# Patient Record
Sex: Female | Born: 1955 | Race: White | Hispanic: No | Smoking: Former smoker
Health system: Southern US, Community
[De-identification: ages and names within clinical notes are randomized; demographics above are authoritative.]

## PROBLEM LIST (undated history)

## (undated) DIAGNOSIS — I10 Essential (primary) hypertension: Secondary | ICD-10-CM

## (undated) DIAGNOSIS — R519 Headache, unspecified: Secondary | ICD-10-CM

## (undated) DIAGNOSIS — F419 Anxiety disorder, unspecified: Secondary | ICD-10-CM

## (undated) DIAGNOSIS — G4700 Insomnia, unspecified: Secondary | ICD-10-CM

## (undated) DIAGNOSIS — K219 Gastro-esophageal reflux disease without esophagitis: Secondary | ICD-10-CM

## (undated) DIAGNOSIS — R51 Headache: Secondary | ICD-10-CM

## (undated) HISTORY — PX: ABDOMINAL HYSTERECTOMY: SHX81

## (undated) HISTORY — PX: TONSILLECTOMY: SUR1361

---

## 2006-02-16 ENCOUNTER — Ambulatory Visit: Payer: Self-pay | Admitting: Gastroenterology

## 2006-05-04 ENCOUNTER — Ambulatory Visit: Payer: Self-pay | Admitting: Family Medicine

## 2006-05-12 ENCOUNTER — Ambulatory Visit: Payer: Self-pay | Admitting: Family Medicine

## 2007-06-22 ENCOUNTER — Ambulatory Visit: Payer: Self-pay | Admitting: Family Medicine

## 2010-11-04 ENCOUNTER — Ambulatory Visit: Payer: Self-pay | Admitting: Internal Medicine

## 2011-09-15 ENCOUNTER — Emergency Department: Payer: Self-pay | Admitting: Emergency Medicine

## 2011-09-15 LAB — TROPONIN I: Troponin-I: <0.02 ng/mL

## 2011-09-15 LAB — CBC
MCH: 30.2 pg (ref 26.0–34.0)
MCHC: 34 g/dL (ref 32.0–36.0)
MCV: 89 fL (ref 80–100)
Platelet: 299 10*3/uL (ref 150–440)
RDW: 13.1 % (ref 11.5–14.5)
WBC: 8.4 10*3/uL (ref 3.6–11.0)

## 2011-09-15 LAB — COMPREHENSIVE METABOLIC PANEL
Albumin: 4.4 g/dL (ref 3.4–5.0)
Alkaline Phosphatase: 79 U/L (ref 50–136)
BUN: 14 mg/dL (ref 7–18)
Bilirubin,Total: 0.7 mg/dL (ref 0.2–1.0)
Calcium, Total: 9.4 mg/dL (ref 8.5–10.1)
Creatinine: 0.72 mg/dL (ref 0.60–1.30)
Glucose: 99 mg/dL (ref 65–99)
Osmolality: 284 (ref 275–301)
Potassium: 3.4 mmol/L — ABNORMAL LOW (ref 3.5–5.1)
SGPT (ALT): 43 U/L (ref 12–78)
Sodium: 142 mmol/L (ref 136–145)
Total Protein: 8.2 g/dL (ref 6.4–8.2)

## 2011-09-15 LAB — LIPASE, BLOOD: Lipase: 141 U/L (ref 73–393)

## 2011-09-15 LAB — MAGNESIUM: Magnesium: 1.9 mg/dL

## 2011-09-15 LAB — CK TOTAL AND CKMB (NOT AT ARMC): CK, Total: 82 U/L (ref 21–215)

## 2013-01-04 ENCOUNTER — Ambulatory Visit: Payer: Self-pay | Admitting: Physician Assistant

## 2014-06-18 ENCOUNTER — Other Ambulatory Visit: Payer: Self-pay

## 2014-06-18 DIAGNOSIS — Z1231 Encounter for screening mammogram for malignant neoplasm of breast: Secondary | ICD-10-CM

## 2014-07-10 ENCOUNTER — Ambulatory Visit
Admission: RE | Admit: 2014-07-10 | Discharge: 2014-07-10 | Disposition: A | Discharge status: Home / Self Care | Payer: BLUE CROSS/BLUE SHIELD | Source: Ambulatory Visit | Attending: Family Medicine | Admitting: Family Medicine

## 2014-07-10 DIAGNOSIS — Z1231 Encounter for screening mammogram for malignant neoplasm of breast: Secondary | ICD-10-CM | POA: Diagnosis not present

## 2014-07-10 DIAGNOSIS — R922 Inconclusive mammogram: Secondary | ICD-10-CM | POA: Diagnosis not present

## 2015-01-23 ENCOUNTER — Other Ambulatory Visit: Payer: Self-pay | Admitting: Orthopedic Surgery

## 2015-01-23 DIAGNOSIS — M25561 Pain in right knee: Principal | ICD-10-CM

## 2015-01-23 DIAGNOSIS — G8929 Other chronic pain: Secondary | ICD-10-CM

## 2015-02-12 ENCOUNTER — Ambulatory Visit
Admission: RE | Admit: 2015-02-12 | Discharge: 2015-02-12 | Disposition: A | Discharge status: Home / Self Care | Payer: BLUE CROSS/BLUE SHIELD | Source: Ambulatory Visit | Attending: Orthopedic Surgery | Admitting: Orthopedic Surgery

## 2015-02-12 DIAGNOSIS — M238X1 Other internal derangements of right knee: Secondary | ICD-10-CM | POA: Diagnosis not present

## 2015-02-12 DIAGNOSIS — M25561 Pain in right knee: Secondary | ICD-10-CM | POA: Diagnosis present

## 2015-02-12 DIAGNOSIS — M179 Osteoarthritis of knee, unspecified: Secondary | ICD-10-CM | POA: Insufficient documentation

## 2015-02-12 DIAGNOSIS — M23321 Other meniscus derangements, posterior horn of medial meniscus, right knee: Secondary | ICD-10-CM | POA: Insufficient documentation

## 2015-02-12 DIAGNOSIS — G8929 Other chronic pain: Secondary | ICD-10-CM | POA: Insufficient documentation

## 2015-03-26 ENCOUNTER — Other Ambulatory Visit: Payer: Self-pay

## 2015-03-26 ENCOUNTER — Encounter: Payer: Self-pay | Admitting: *Deleted

## 2015-03-26 ENCOUNTER — Encounter
Admission: RE | Admit: 2015-03-26 | Discharge: 2015-03-26 | Disposition: A | Discharge status: Home / Self Care | Payer: BLUE CROSS/BLUE SHIELD | Source: Ambulatory Visit | Attending: Orthopedic Surgery | Admitting: Orthopedic Surgery

## 2015-03-26 DIAGNOSIS — K219 Gastro-esophageal reflux disease without esophagitis: Secondary | ICD-10-CM | POA: Diagnosis not present

## 2015-03-26 DIAGNOSIS — Z79899 Other long term (current) drug therapy: Secondary | ICD-10-CM | POA: Diagnosis not present

## 2015-03-26 DIAGNOSIS — Z90710 Acquired absence of both cervix and uterus: Secondary | ICD-10-CM | POA: Diagnosis not present

## 2015-03-26 DIAGNOSIS — Z888 Allergy status to other drugs, medicaments and biological substances status: Secondary | ICD-10-CM | POA: Diagnosis not present

## 2015-03-26 DIAGNOSIS — Z87891 Personal history of nicotine dependence: Secondary | ICD-10-CM | POA: Diagnosis not present

## 2015-03-26 DIAGNOSIS — M2391 Unspecified internal derangement of right knee: Secondary | ICD-10-CM | POA: Diagnosis present

## 2015-03-26 DIAGNOSIS — M2241 Chondromalacia patellae, right knee: Secondary | ICD-10-CM | POA: Diagnosis not present

## 2015-03-26 DIAGNOSIS — X58XXXA Exposure to other specified factors, initial encounter: Secondary | ICD-10-CM | POA: Diagnosis not present

## 2015-03-26 DIAGNOSIS — Z887 Allergy status to serum and vaccine status: Secondary | ICD-10-CM | POA: Diagnosis not present

## 2015-03-26 DIAGNOSIS — Z8349 Family history of other endocrine, nutritional and metabolic diseases: Secondary | ICD-10-CM | POA: Diagnosis not present

## 2015-03-26 DIAGNOSIS — Z791 Long term (current) use of non-steroidal anti-inflammatories (NSAID): Secondary | ICD-10-CM | POA: Diagnosis not present

## 2015-03-26 DIAGNOSIS — S83281A Other tear of lateral meniscus, current injury, right knee, initial encounter: Secondary | ICD-10-CM | POA: Diagnosis not present

## 2015-03-26 DIAGNOSIS — Z885 Allergy status to narcotic agent status: Secondary | ICD-10-CM | POA: Diagnosis not present

## 2015-03-26 DIAGNOSIS — Z79891 Long term (current) use of opiate analgesic: Secondary | ICD-10-CM | POA: Diagnosis not present

## 2015-03-26 DIAGNOSIS — G4700 Insomnia, unspecified: Secondary | ICD-10-CM | POA: Diagnosis not present

## 2015-03-26 DIAGNOSIS — S83241A Other tear of medial meniscus, current injury, right knee, initial encounter: Secondary | ICD-10-CM | POA: Diagnosis not present

## 2015-03-26 LAB — POTASSIUM: Potassium: 3.8 mmol/L (ref 3.5–5.1)

## 2015-03-26 NOTE — Patient Instructions (Signed)
  Your procedure is scheduled on: 03/27/15 Report to Day Surgery. [facility] To find out your arrival time please call [phone] between 1PM - 3PM on  03/26/15  Remember: Instructions that are not followed completely may result in serious medical risk, up to and including death, or upon the discretion of your surgeon and anesthesiologist your surgery may need to be rescheduled.    __X__ 1. Do not eat food or drink liquids after midnight. No gum chewing or hard candies.     __X__ 2. No Alcohol for 24 hours before or after surgery.   ____ 3. Bring all medications with you on the day of surgery if instructed.    _X___ 4. Notify your doctor if there is any change in your medical condition     (cold, fever, infections).     Do not wear jewelry, make-up, hairpins, clips or nail polish.  Do not wear lotions, powders, or perfumes. You may wear deodorant.  Do not shave 48 hours prior to surgery. Men may shave face and neck.  Do not bring valuables to the hospital.    [facility] is not responsible for any belongings or valuables.               Contacts, dentures or bridgework may not be worn into surgery.  Leave your suitcase in the car. After surgery it may be brought to your room.  For patients admitted to the hospital, discharge time is determined by your                treatment team.   Patients discharged the day of surgery will not be allowed to drive home.   Please read over the following fact sheets that you were given:   Surgical Site Infection Prevention   ____ Take these medicines the morning of surgery with A SIP OF WATER:    1. PEPCID AT BEDTIME 03/26/15 AND AM SURGERY  2.   3.   4.  5.  6.  ____ Fleet Enema (as directed)   ___X_ Use CHG Soap as directed  ____ Use inhalers on the day of surgery  ____ Stop metformin 2 days prior to surgery    ____ Take 1/2 of usual insulin dose the night before surgery and none on the morning of surgery.   ____  Stop Coumadin/Plavix/aspirin on  _X___ Stop Anti-inflammatories on 03/26/15    _X___ Stop supplements until after surgery.    ____ Bring C-Pap to the hospital.

## 2015-03-27 ENCOUNTER — Ambulatory Visit: Payer: BLUE CROSS/BLUE SHIELD | Admitting: Anesthesiology

## 2015-03-27 ENCOUNTER — Ambulatory Visit
Admission: RE | Admit: 2015-03-27 | Discharge: 2015-03-27 | Disposition: A | Discharge status: Home / Self Care | Payer: BLUE CROSS/BLUE SHIELD | Source: Ambulatory Visit | Attending: Orthopedic Surgery | Admitting: Orthopedic Surgery

## 2015-03-27 ENCOUNTER — Encounter: Payer: Self-pay | Admitting: Orthopedic Surgery

## 2015-03-27 ENCOUNTER — Encounter
Admission: RE | Disposition: A | Discharge status: Home / Self Care | Payer: Self-pay | Source: Ambulatory Visit | Attending: Orthopedic Surgery

## 2015-03-27 DIAGNOSIS — M2241 Chondromalacia patellae, right knee: Secondary | ICD-10-CM | POA: Insufficient documentation

## 2015-03-27 DIAGNOSIS — S83241A Other tear of medial meniscus, current injury, right knee, initial encounter: Secondary | ICD-10-CM | POA: Insufficient documentation

## 2015-03-27 DIAGNOSIS — S83281A Other tear of lateral meniscus, current injury, right knee, initial encounter: Secondary | ICD-10-CM | POA: Diagnosis not present

## 2015-03-27 DIAGNOSIS — K219 Gastro-esophageal reflux disease without esophagitis: Secondary | ICD-10-CM | POA: Insufficient documentation

## 2015-03-27 DIAGNOSIS — Z888 Allergy status to other drugs, medicaments and biological substances status: Secondary | ICD-10-CM | POA: Insufficient documentation

## 2015-03-27 DIAGNOSIS — Z79899 Other long term (current) drug therapy: Secondary | ICD-10-CM | POA: Insufficient documentation

## 2015-03-27 DIAGNOSIS — Z791 Long term (current) use of non-steroidal anti-inflammatories (NSAID): Secondary | ICD-10-CM | POA: Insufficient documentation

## 2015-03-27 DIAGNOSIS — Z887 Allergy status to serum and vaccine status: Secondary | ICD-10-CM | POA: Insufficient documentation

## 2015-03-27 DIAGNOSIS — Z90710 Acquired absence of both cervix and uterus: Secondary | ICD-10-CM | POA: Insufficient documentation

## 2015-03-27 DIAGNOSIS — Z79891 Long term (current) use of opiate analgesic: Secondary | ICD-10-CM | POA: Insufficient documentation

## 2015-03-27 DIAGNOSIS — Z87891 Personal history of nicotine dependence: Secondary | ICD-10-CM | POA: Insufficient documentation

## 2015-03-27 DIAGNOSIS — Z885 Allergy status to narcotic agent status: Secondary | ICD-10-CM | POA: Insufficient documentation

## 2015-03-27 DIAGNOSIS — Z8349 Family history of other endocrine, nutritional and metabolic diseases: Secondary | ICD-10-CM | POA: Insufficient documentation

## 2015-03-27 DIAGNOSIS — X58XXXA Exposure to other specified factors, initial encounter: Secondary | ICD-10-CM | POA: Insufficient documentation

## 2015-03-27 DIAGNOSIS — G4700 Insomnia, unspecified: Secondary | ICD-10-CM | POA: Insufficient documentation

## 2015-03-27 HISTORY — DX: Anxiety disorder, unspecified: F41.9

## 2015-03-27 HISTORY — DX: Gastro-esophageal reflux disease without esophagitis: K21.9

## 2015-03-27 HISTORY — DX: Essential (primary) hypertension: I10

## 2015-03-27 HISTORY — PX: KNEE ARTHROSCOPY: SHX127

## 2015-03-27 HISTORY — DX: Insomnia, unspecified: G47.00

## 2015-03-27 HISTORY — DX: Headache: R51

## 2015-03-27 HISTORY — DX: Headache, unspecified: R51.9

## 2015-03-27 SURGERY — ARTHROSCOPY, KNEE
Anesthesia: General | Site: Knee | Laterality: Right | Wound class: Clean

## 2015-03-27 MED ORDER — ACETAMINOPHEN 10 MG/ML IV SOLN
INTRAVENOUS | Status: DC | PRN
  Administered 2015-03-27: 1000 mg via INTRAVENOUS

## 2015-03-27 MED ORDER — BUPIVACAINE HCL 0.25 % IJ SOLN
INTRAMUSCULAR | Status: DC | PRN
  Administered 2015-03-27: 5 mL
  Administered 2015-03-27: 25 mL

## 2015-03-27 MED ORDER — BUPIVACAINE-EPINEPHRINE (PF) 0.25% -1:200000 IJ SOLN
INTRAMUSCULAR | Status: AC
  Filled 2015-03-27: qty 30

## 2015-03-27 MED ORDER — ACETAMINOPHEN 10 MG/ML IV SOLN
INTRAVENOUS | Status: AC
  Filled 2015-03-27: qty 100

## 2015-03-27 MED ORDER — METOCLOPRAMIDE HCL 5 MG/ML IJ SOLN: 5.0000 mg | Freq: Three times a day (TID) | INTRAMUSCULAR | Status: DC | PRN

## 2015-03-27 MED ORDER — FENTANYL CITRATE (PF) 100 MCG/2ML IJ SOLN
25.0000 ug | INTRAMUSCULAR | Status: DC | PRN
  Administered 2015-03-27 (×4): 25 ug via INTRAVENOUS

## 2015-03-27 MED ORDER — MORPHINE SULFATE (PF) 4 MG/ML IV SOLN
INTRAVENOUS | Status: AC
  Filled 2015-03-27: qty 1

## 2015-03-27 MED ORDER — SODIUM CHLORIDE 0.9 % IV SOLN: INTRAVENOUS | Status: DC

## 2015-03-27 MED ORDER — ONDANSETRON HCL 4 MG/2ML IJ SOLN
INTRAMUSCULAR | Status: DC | PRN
  Administered 2015-03-27: 4 mg via INTRAVENOUS

## 2015-03-27 MED ORDER — MORPHINE SULFATE (PF) 4 MG/ML IV SOLN
INTRAVENOUS | Status: DC | PRN
  Administered 2015-03-27: 4 mg

## 2015-03-27 MED ORDER — HYDROCODONE-ACETAMINOPHEN 5-325 MG PO TABS
ORAL_TABLET | ORAL | Status: AC
  Administered 2015-03-27: 1 via ORAL
  Filled 2015-03-27: qty 1

## 2015-03-27 MED ORDER — HYDROCODONE-ACETAMINOPHEN 5-325 MG PO TABS
1.0000 | ORAL_TABLET | ORAL | Status: DC | PRN
  Administered 2015-03-27: 1 via ORAL

## 2015-03-27 MED ORDER — FENTANYL CITRATE (PF) 100 MCG/2ML IJ SOLN
INTRAMUSCULAR | Status: AC
  Administered 2015-03-27: 25 ug via INTRAVENOUS
  Filled 2015-03-27: qty 2

## 2015-03-27 MED ORDER — PROPOFOL 10 MG/ML IV BOLUS
INTRAVENOUS | Status: DC | PRN
  Administered 2015-03-27: 150 mg via INTRAVENOUS

## 2015-03-27 MED ORDER — ONDANSETRON HCL 4 MG/2ML IJ SOLN: 4.0000 mg | Freq: Once | INTRAMUSCULAR | Status: DC | PRN

## 2015-03-27 MED ORDER — CHLORHEXIDINE GLUCONATE 4 % EX LIQD: 60.0000 mL | Freq: Once | CUTANEOUS | Status: DC

## 2015-03-27 MED ORDER — FENTANYL CITRATE (PF) 100 MCG/2ML IJ SOLN
INTRAMUSCULAR | Status: DC | PRN
Start: 2015-03-27 — End: 2015-03-27
  Administered 2015-03-27: 100 ug via INTRAVENOUS

## 2015-03-27 MED ORDER — METOCLOPRAMIDE HCL 10 MG PO TABS: 5.0000 mg | ORAL_TABLET | Freq: Three times a day (TID) | ORAL | Status: DC | PRN

## 2015-03-27 MED ORDER — LACTATED RINGERS IV SOLN
INTRAVENOUS | Status: DC
  Administered 2015-03-27 (×2): via INTRAVENOUS

## 2015-03-27 MED ORDER — ONDANSETRON HCL 4 MG/2ML IJ SOLN: 4.0000 mg | Freq: Four times a day (QID) | INTRAMUSCULAR | Status: DC | PRN

## 2015-03-27 MED ORDER — HYDROCODONE-ACETAMINOPHEN 5-325 MG PO TABS: 1.0000 | ORAL_TABLET | ORAL | Status: DC | PRN

## 2015-03-27 MED ORDER — MIDAZOLAM HCL 2 MG/2ML IJ SOLN
INTRAMUSCULAR | Status: DC | PRN
  Administered 2015-03-27: 2 mg via INTRAVENOUS

## 2015-03-27 MED ORDER — LIDOCAINE HCL (CARDIAC) 20 MG/ML IV SOLN
INTRAVENOUS | Status: DC | PRN
  Administered 2015-03-27: 30 mg via INTRAVENOUS

## 2015-03-27 MED ORDER — ONDANSETRON HCL 4 MG PO TABS: 4.0000 mg | ORAL_TABLET | Freq: Four times a day (QID) | ORAL | Status: DC | PRN

## 2015-03-27 SURGICAL SUPPLY — 23 items
BLADE SHAVER 4.5 DBL SERAT CV (CUTTER) ×2 IMPLANT
BNDG ESMARK 6X12 TAN STRL LF (GAUZE/BANDAGES/DRESSINGS) ×2 IMPLANT
DRSG DERMACEA 8X12 NADH (GAUZE/BANDAGES/DRESSINGS) ×2 IMPLANT
DURAPREP 26ML APPLICATOR (WOUND CARE) ×4 IMPLANT
GAUZE SPONGE 4X4 12PLY STRL (GAUZE/BANDAGES/DRESSINGS) ×2 IMPLANT
GLOVE BIOGEL M STRL SZ7.5 (GLOVE) ×2 IMPLANT
GLOVE INDICATOR 8.0 STRL GRN (GLOVE) ×2 IMPLANT
GOWN STRL REUS W/ TWL LRG LVL3 (GOWN DISPOSABLE) ×1 IMPLANT
GOWN STRL REUS W/ TWL LRG LVL4 (GOWN DISPOSABLE) ×1 IMPLANT
GOWN STRL REUS W/TWL LRG LVL3 (GOWN DISPOSABLE) ×1
GOWN STRL REUS W/TWL LRG LVL4 (GOWN DISPOSABLE) ×1
IV LACTATED RINGER IRRG 3000ML (IV SOLUTION) ×6
IV LR IRRIG 3000ML ARTHROMATIC (IV SOLUTION) ×6 IMPLANT
MANIFOLD NEPTUNE II (INSTRUMENTS) ×2 IMPLANT
PACK ARTHROSCOPY KNEE (MISCELLANEOUS) ×2 IMPLANT
SET TUBE SUCT SHAVER OUTFL 24K (TUBING) ×2 IMPLANT
SET TUBE TIP INTRA-ARTICULAR (MISCELLANEOUS) ×2 IMPLANT
STRAP SAFETY BODY (MISCELLANEOUS) ×2 IMPLANT
SUT ETHILON 3-0 FS-10 30 BLK (SUTURE) ×2
SUTURE EHLN 3-0 FS-10 30 BLK (SUTURE) ×1 IMPLANT
TUBING ARTHRO INFLOW-ONLY STRL (TUBING) ×2 IMPLANT
WAND HAND CNTRL MULTIVAC 50 (MISCELLANEOUS) ×2 IMPLANT
WRAP KNEE W/COLD PACKS 25.5X14 (SOFTGOODS) ×2 IMPLANT

## 2015-03-27 NOTE — Anesthesia Preprocedure Evaluation (Signed)
Anesthesia Evaluation  Patient identified by MRN, date of birth, ID band Patient awake    Reviewed: Allergy & Precautions, H&P , NPO status , Patient's Chart, lab work & pertinent test results, reviewed documented beta blocker date and time   Airway Mallampati: II  TM Distance: >3 FB Neck ROM: full    Dental  (+) Teeth Intact   Pulmonary neg pulmonary ROS, former smoker,    Pulmonary exam normal        Cardiovascular Exercise Tolerance: Good hypertension, negative cardio ROS Normal cardiovascular exam Rate:Normal     Neuro/Psych negative neurological ROS  negative psych ROS   GI/Hepatic negative GI ROS, Neg liver ROS,   Endo/Other  negative endocrine ROS  Renal/GU negative Renal ROS  negative genitourinary   Musculoskeletal   Abdominal   Peds  Hematology negative hematology ROS (+)   Anesthesia Other Findings   Reproductive/Obstetrics negative OB ROS                             Anesthesia Physical Anesthesia Plan  ASA: II  Anesthesia Plan: General LMA   Post-op Pain Management:    Induction:   Airway Management Planned:   Additional Equipment:   Intra-op Plan:   Post-operative Plan:   Informed Consent: I have reviewed the patients History and Physical, chart, labs and discussed the procedure including the risks, benefits and alternatives for the proposed anesthesia with the patient or authorized representative who has indicated his/her understanding and acceptance.     Plan Discussed with: CRNA  Anesthesia Plan Comments:         Anesthesia Quick Evaluation

## 2015-03-27 NOTE — Brief Op Note (Signed)
03/27/2015  12:48 PM  PATIENT:  Audrey Hanna  60 y.o. 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   Delay start of Pharmacological VTE agent (>24hrs) due to surgical blood loss or risk of bleeding: not applicable

## 2015-03-27 NOTE — H&P (Signed)
The patient has been re-examined, and the chart reviewed, and there have been no interval changes to the documented history and physical.    The risks, benefits, and alternatives have been discussed at length. The patient expressed understanding of the risks benefits and agreed with plans for surgical intervention.  Graycen Degan P. Delitha Elms, Jr. M.D.    

## 2015-03-27 NOTE — Discharge Instructions (Signed)
°  Instructions after Knee Arthroscopy  ° ° James P. Hooten, Jr., M.D.    ° Dept. of Orthopaedics & Sports Medicine ° [facility] ° [address] ° [place], [place]  [address] ° ° Phone: [phone]   Fax: [phone] ° ° °DIET: °• Drink plenty of non-alcoholic fluids & begin a light diet. °• Resume your normal diet the day after surgery. ° °ACTIVITY:  °• You may use crutches or a walker with weight-bearing as tolerated, unless instructed otherwise. °• You may wean yourself off of the walker or crutches as tolerated.  °• Begin doing gentle exercises. Exercising will reduce the pain and swelling, increase motion, and prevent muscle weakness.   °• Avoid strenuous activities or athletics for a minimum of 4-6 weeks after arthroscopic surgery. °• Do not drive or operate any equipment until instructed. ° °WOUND CARE:  °• Place one to two pillows under the knee the first day or two when sitting or lying.  °• Continue to use the ice packs periodically to reduce pain and swelling. °• The small incisions in your knee are closed with nylon stitches. The stitches will be removed in the office. °• The bulky dressing may be removed on the second day after surgery. DO NOT TOUCH THE STITCHES. Put a Band-Aid over each stitch. Do NOT use any ointments or creams on the incisions.  °• You may bathe or shower after the stitches are removed at the first office visit following surgery. ° °MEDICATIONS: °• You may resume your regular medications. °• Please take the pain medication as prescribed. °• Do not take pain medication on an empty stomach. °• Do not drive or drink alcoholic beverages when taking pain medications. ° °CALL THE OFFICE FOR: °• Temperature above 101 degrees °• Excessive bleeding or drainage on the dressing. °• Excessive swelling, coldness, or paleness of the toes. °• Persistent nausea and vomiting. ° °FOLLOW-UP:  °You should have an appointment to return to the office in 7-10 days after surgery.  AMBULATORY  SURGERY  °DISCHARGE INSTRUCTIONS ° ° °The drugs that you were given will stay in your system until tomorrow so for the next 24 hours you should not: ° °Drive an automobile °Make any legal decisions °Drink any alcoholic beverage ° ° °You may resume regular meals tomorrow.  Today it is better to start with liquids and gradually work up to solid foods. ° °You may eat anything you prefer, but it is better to start with liquids, then soup and crackers, and gradually work up to solid foods. ° ° °Please notify your doctor immediately if you have any unusual bleeding, trouble breathing, redness and pain at the surgery site, drainage, fever, or pain not relieved by medication. ° ° ° °Additional Instructions: ° ° ° ° ° ° ° °• Please contact your physician with any problems or Same Day Surgery at [phone], Monday through Friday 6 am to 4 pm, or [facility] number at [phone]. °

## 2015-03-27 NOTE — Anesthesia Procedure Notes (Signed)
Procedure Name: LMA Insertion Date/Time: 03/27/2015 11:38 AM Performed by: Junious Silk Pre-anesthesia Checklist: Patient identified, Patient being monitored, Timeout performed, Emergency Drugs available and Suction available Patient Re-evaluated:Patient Re-evaluated prior to inductionOxygen Delivery Method: Circle system utilized Preoxygenation: Pre-oxygenation with 100% oxygen Intubation Type: IV induction Ventilation: Mask ventilation without difficulty LMA: LMA inserted LMA Size: 3.5 Tube type: Oral Number of attempts: 1 Placement Confirmation: positive ETCO2 and breath sounds checked- equal and bilateral Tube secured with: Tape Dental Injury: Teeth and Oropharynx as per pre-operative assessment

## 2015-03-27 NOTE — Transfer of Care (Signed)
Immediate Anesthesia Transfer of Care Note  Patient: Audrey Hanna  Procedure(s) Performed: Procedure(s): ARTHROSCOPY KNEE RIGHT, PARTIAL MEDIAL AND LATERAL MENISECTOMY, CHONDROPLASTY PATELLOFEMORAL (Right)  Patient Location: PACU  Anesthesia Type:General  Level of Consciousness: awake and patient cooperative  Airway & Oxygen Therapy: Patient Spontanous Breathing and Patient connected to face mask oxygen  Post-op Assessment: Report given to RN and Post -op Vital signs reviewed and stable  Post vital signs: Reviewed and stable  Last Vitals:  Filed Vitals:   03/27/15 1017  BP: 159/65  Pulse: 65  Temp: 36.4 C  Resp: 16    Complications: No apparent anesthesia complications

## 2015-03-27 NOTE — Op Note (Signed)
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   ASSISTANT: none  ANESTHESIA: general  ESTIMATED BLOOD LOSS: Minimal  FLUIDS REPLACED: 600 mL of crystalloid  TOURNIQUET TIME: Not used   DRAINS: none  IMPLANTS UTILIZED: None  INDICATIONS FOR SURGERY: Audrey Hanna is a 60 y.o. year old female who has been seen for complaints of right knee pain. MRI demonstrated findings consistent with meniscal pathology. After discussion of the risks and benefits of surgical intervention, the patient expressed understanding of the risks benefits and agree with plans for right knee arthroscopy.   PROCEDURE IN DETAIL: The patient was brought into the operating room and, after adequate general anesthesia was achieved, a tourniquet was applied to the right thigh and the leg was placed in the leg holder. All bony prominences were well padded. The patient's right knee was cleaned and prepped with alcohol and Duraprep and draped in the usual sterile fashion. A "timeout" was performed as per usual protocol. The anticipated portal sites were injected with 0.25% Marcaine with epinephrine. An anterolateral incision was made and a cannula was inserted. A small effusion was evacuated and the knee was distended with fluid using the pump. The scope was advanced down the medial gutter into the medial compartment. Under visualization with the scope, an anteromedial portal was created and a hooked probe was inserted. The medial meniscus was  visualized and probed. There was a degenerative tear involving the posterior horn of the medial meniscus. The tear was debrided using the 4.5 mm incisor shaver and then contoured using the 50 ArthroCare wand. The remaining rim of meniscus was visualized and probed and felt to be stable. The articular cartilage was visualized. Mild changes were noted to the articular surface without significant defect.  The scope was then advanced into the intercondylar notch. The anterior cruciate ligament was visualized and probed and felt to be intact. The scope was removed from the lateral portal and reinserted via the anteromedial portal to better visualize the lateral compartment. The lateral meniscus was visualized and probed. There was a small radial tear involving the lateral aspect of the lateral. The tear was debrided using the 4.5 mm incisor shaver and contoured using the ArthroCare wand. The remaining rim of meniscus was visualized and probed and felt to be stable. The articular cartilage of the lateral compartment was visualized and noted to be in good condition. Finally, the scope was advanced so as to visualize the patellofemoral articulation. Good patellar tracking was appreciated. Grade 3 changes of chondromalacia were noted to the medial and lateral facets of the patella and less so to the trochlear groove. These areas were debrided and contoured using the ArthroCare wand.  The knee was irrigated with copius amounts of fluid and suctioned dry. The anterolateral portal was re-approximated with #3-0 nylon. A combination of 0.25% Marcaine with epinephrine and 4 mg of Morphine were injected via the scope. The scope was removed and the anteromedial portal was re-approximated with #3-0 nylon. A sterile dressing was applied followed by application of  an ice wrap.  The patient tolerated the procedure well and was transported to the PACU in stable condition.  James P. Angie Fava., M.D.

## 2015-03-28 NOTE — Anesthesia Postprocedure Evaluation (Signed)
Anesthesia Post Note  Patient: JAMYRA ZWEIG  Procedure(s) Performed: Procedure(s) (LRB): ARTHROSCOPY KNEE RIGHT, PARTIAL MEDIAL AND LATERAL MENISECTOMY, CHONDROPLASTY PATELLOFEMORAL (Right)  Patient location during evaluation: PACU Anesthesia Type: General Level of consciousness: awake and alert Pain management: pain level controlled Vital Signs Assessment: post-procedure vital signs reviewed and stable Respiratory status: spontaneous breathing, nonlabored ventilation, respiratory function stable and patient connected to nasal cannula oxygen Cardiovascular status: blood pressure returned to baseline and stable Postop Assessment: no signs of nausea or vomiting Anesthetic complications: no    Last Vitals:  Filed Vitals:   03/27/15 1342 03/27/15 1353  BP:  147/73  Pulse: 56 57  Temp: 36.3 C 36.3 C  Resp: 12 20    Last Pain:  Filed Vitals:   03/27/15 1355  PainSc: 4                  Yevette Edwards

## 2015-06-26 ENCOUNTER — Other Ambulatory Visit: Payer: Self-pay | Admitting: Family Medicine

## 2015-06-26 DIAGNOSIS — Z1231 Encounter for screening mammogram for malignant neoplasm of breast: Secondary | ICD-10-CM

## 2015-08-15 ENCOUNTER — Ambulatory Visit
Admission: RE | Admit: 2015-08-15 | Discharge: 2015-08-15 | Disposition: A | Discharge status: Home / Self Care | Payer: BLUE CROSS/BLUE SHIELD | Source: Ambulatory Visit | Attending: Family Medicine | Admitting: Family Medicine

## 2015-08-15 DIAGNOSIS — Z1231 Encounter for screening mammogram for malignant neoplasm of breast: Secondary | ICD-10-CM | POA: Diagnosis not present

## 2015-10-30 ENCOUNTER — Ambulatory Visit
Admission: EM | Admit: 2015-10-30 | Discharge: 2015-10-30 | Disposition: A | Discharge status: Home / Self Care | Payer: BLUE CROSS/BLUE SHIELD | Attending: Family Medicine | Admitting: Family Medicine

## 2015-10-30 DIAGNOSIS — M545 Low back pain, unspecified: Secondary | ICD-10-CM

## 2015-10-30 DIAGNOSIS — R21 Rash and other nonspecific skin eruption: Secondary | ICD-10-CM

## 2015-10-30 MED ORDER — VALACYCLOVIR HCL 1 G PO TABS: 1000.0000 mg | ORAL_TABLET | Freq: Three times a day (TID) | ORAL | 0 refills | Status: DC

## 2015-10-30 NOTE — ED Triage Notes (Signed)
Patient complains of rash on her back that she thinks may be shingles related. Patient states that she is having shooting pains at the location of the rash. Patient states that she overall has been feeling bad today and then noticed the area at work today.

## 2015-12-04 NOTE — ED Provider Notes (Signed)
[facility]    CSN: [id number] Arrival date & time: 10/30/15  1827     History   Chief Complaint Chief Complaint  Patient presents with  . Rash    HPI Audrey Hanna is a 60 y.o. female.   60 yo female with a c/o rash on her back that she thinks may be shingles related. Patient states that she is having shooting pains at the location of the rash. Patient states that she overall has been feeling bad today. Denies any fevers, chills, falls, trauma.    The history is provided by the patient.  Rash    Past Medical History:  Diagnosis Date  . Anxiety   . GERD (gastroesophageal reflux disease)   . Headache   . Hypertension   . Insomnia     There are no active problems to display for this patient.   Past Surgical History:  Procedure Laterality Date  . ABDOMINAL HYSTERECTOMY     2009 BSO  . KNEE ARTHROSCOPY Right 03/27/2015   Procedure: ARTHROSCOPY KNEE RIGHT, PARTIAL MEDIAL AND LATERAL MENISECTOMY, CHONDROPLASTY PATELLOFEMORAL;  Surgeon: Donato Heinz, MD;  Location: [facility] ORS;  Service: Orthopedics;  Laterality: Right;  . TONSILLECTOMY      OB History    No data available       Home Medications    Prior to Admission medications   Medication Sig Start Date End Date Taking? Authorizing Provider  chlorthalidone (HYGROTON) 25 MG tablet Take 25 mg by mouth daily.    Historical Provider, MD  famotidine (PEPCID) 10 MG tablet Take 10 mg by mouth daily.    Historical Provider, MD  gabapentin (NEURONTIN) 100 MG capsule Take 100 mg by mouth at bedtime.    Historical Provider, MD  HYDROcodone-acetaminophen (NORCO) 5-325 MG tablet Take 1-2 tablets by mouth every 4 (four) hours as needed for moderate pain. 03/27/15   Donato Heinz, MD  HYDROcodone-acetaminophen (NORCO/VICODIN) 5-325 MG tablet Take 1 tablet by mouth every 4 (four) hours as needed for moderate pain.    Historical Provider, MD  meloxicam (MOBIC) 15 MG tablet Take 15 mg by mouth daily.    Historical  Provider, MD  naproxen sodium (ANAPROX) 220 MG tablet Take 220 mg by mouth 2 (two) times daily with a meal. Reported on 03/27/2015    Historical Provider, MD  traMADol (ULTRAM) 50 MG tablet Take by mouth. Reported on 03/27/2015    Historical Provider, MD  valACYclovir (VALTREX) 1000 MG tablet Take 1 tablet (1,000 mg total) by mouth 3 (three) times daily. 10/30/15   Payton Mccallum, MD    Family History Family History  Problem Relation Age of Onset  . Breast cancer Mother 84    Social History Social History  Substance Use Topics  . Smoking status: Former Games developer  . Smokeless tobacco: Never Used  . Alcohol use Yes     Comment: occasionally     Allergies   Codeine; Influenza vaccines; and Nexium [esomeprazole magnesium]   Review of Systems Review of Systems  Skin: Positive for rash.     Physical Exam Triage Vital Signs ED Triage Vitals  Enc Vitals Group     BP 10/30/15 1850 (!) 167/58     Pulse Rate 10/30/15 1850 60     Resp 10/30/15 1850 16     Temp 10/30/15 1850 98.3 F (36.8 C)     Temp Source 10/30/15 1850 Oral     SpO2 10/30/15 1850 99 %     Weight  10/30/15 1849 165 lb (74.8 kg)     Height 10/30/15 1849 5\' 5"  (1.651 m)     Head Circumference --      Peak Flow --      Pain Score 10/30/15 1851 10     Pain Loc --      Pain Edu? --      Excl. in GC? --    No data found.   Updated Vital Signs BP (!) 167/58 (BP Location: Left Arm)   Pulse 60   Temp 98.3 F (36.8 C) (Oral)   Resp 16   Ht 5\' 5"  (1.651 m)   Wt 165 lb (74.8 kg)   SpO2 99%   BMI 27.46 kg/m   Visual Acuity Right Eye Distance:   Left Eye Distance:   Bilateral Distance:    Right Eye Near:   Left Eye Near:    Bilateral Near:     Physical Exam  Constitutional: She appears well-developed and well-nourished. No distress.  Musculoskeletal:       Thoracic back: She exhibits tenderness (muscles). She exhibits normal range of motion, no bony tenderness, no swelling, no edema, no deformity, no  laceration, no pain, no spasm and normal pulse.       Back:  Skin: Rash noted. Rash is vesicular. She is not diaphoretic.     Nursing note and vitals reviewed.    UC Treatments / Results  Labs (all labs ordered are listed, but only abnormal results are displayed) Labs Reviewed - No data to display  EKG  EKG Interpretation None       Radiology No results found.  Procedures Procedures (including critical care time)  Medications Ordered in UC Medications - No data to display   Initial Impression / Assessment and Plan / UC Course  I have reviewed the triage vital signs and the nursing notes.  Pertinent labs & imaging results that were available during my care of the patient were reviewed by me and considered in my medical decision making (see chart for details).  Clinical Course      Final Clinical Impressions(s) / UC Diagnoses   Final diagnoses:  Right-sided low back pain without sciatica  Rash    New Prescriptions Discharge Medication List as of 10/30/2015  7:14 PM    START taking these medications   Details  valACYclovir (VALTREX) 1000 MG tablet Take 1 tablet (1,000 mg total) by mouth 3 (three) times daily., Starting Wed 10/30/2015, Print       1. diagnosis reviewed with patient 2. rx as per orders above; reviewed possible side effects, interactions, risks and benefits  3. Recommend supportive treatment with otc analgesics prn 4. Follow-up prn if symptoms worsen or don't improve   Payton Mccallumrlando Teonna Coonan, MD 12/04/15 1056

## 2017-05-07 ENCOUNTER — Ambulatory Visit: Payer: BLUE CROSS/BLUE SHIELD

## 2017-05-07 ENCOUNTER — Encounter: Payer: Self-pay | Admitting: Emergency Medicine

## 2017-05-07 ENCOUNTER — Other Ambulatory Visit: Payer: Self-pay

## 2017-05-07 ENCOUNTER — Ambulatory Visit
Admission: EM | Admit: 2017-05-07 | Discharge: 2017-05-07 | Disposition: A | Discharge status: Home / Self Care | Payer: BLUE CROSS/BLUE SHIELD | Attending: Emergency Medicine | Admitting: Emergency Medicine

## 2017-05-07 DIAGNOSIS — M79671 Pain in right foot: Secondary | ICD-10-CM | POA: Diagnosis not present

## 2017-05-07 LAB — URIC ACID: URIC ACID, SERUM: 5.7 mg/dL (ref 2.3–6.6)

## 2017-05-07 MED ORDER — MELOXICAM 15 MG PO TABS: 15.0000 mg | ORAL_TABLET | Freq: Every day | ORAL | 0 refills | Status: DC | PRN

## 2017-05-07 NOTE — Discharge Instructions (Signed)
Take medication as prescribed. Rest. Drink plenty of fluids.  ° °Follow up with your primary care physician this week as needed. Return to Urgent care for new or worsening concerns.  ° °

## 2017-05-07 NOTE — ED Provider Notes (Signed)
[facility] ____________________________________________  Time seen: Approximately 9:00 AM  I have reviewed the triage vital signs and the nursing notes.   HISTORY  Chief Complaint Foot Pain   HPI Audrey Hanna is a 62 y.o. female presenting for evaluation of right foot pain that is been present for the last 5 days.  Patient reports no fall or known trigger for current pain.  States that she does walk every day and does report Monday she walked more and not sure if it further aggravated.  States that she woke up Sunday having pain to mid right foot that sometimes radiates to the bottom of her foot.  Occasional radiation upwards towards right leg, but reports she has right knee arthritis and that often aches.  Has continue to remain ambulatory but walking and pressure to the foot increases pain.  States several nights of having to prop sheet off of her foot because that she even lightly touching her foot because of pain.  Denies paresthesias, decreased range of motion, rash, break in skin, insect bite or other possible triggers.  Denies history of similar in the past.  No history of gout or family history.  Denies recent diet changes.  Reports otherwise feels well.  States did take some over-the-counter naproxen over the last 2 days and states that that did slightly improve pain during the day but pain was still present last night.  Denies chest pain, shortness of breath, fevers, abdominal pain, dysuria, or rash. Denies recent sickness. Denies recent antibiotic use.  Denies cardiac history or renal insufficiency.   Past Medical History:  Diagnosis Date  . Anxiety   . GERD (gastroesophageal reflux disease)   . Headache   . Hypertension   . Insomnia     There are no active problems to display for this patient.   Past Surgical History:  Procedure Laterality Date  . ABDOMINAL HYSTERECTOMY     20 09 BSO  . KNEE ARTHROSCOPY Right 03/27/2015   Procedure: ARTHROSCOPY KNEE  RIGHT, PARTIAL MEDIAL AND LATERAL MENISECTOMY, CHONDROPLASTY PATELLOFEMORAL;  Surgeon: Donato Heinz, MD;  Location: [facility] OR[facility];  Service: Orthopedics;  Laterality: Right;  . TONSILLECTOMY       No current facility-administered medications for this encounter.   Current Outpatient Medications:  .  famotidine (PEPCID) 10 MG tablet, Take 10 mg by mouth daily., Disp: , Rfl:  .  naproxen sodium (ANAPROX) 220 MG tablet, Take 220 mg by mouth 2 (two) times daily with a meal. Reported on 03/27/2015, Disp: , Rfl:  .  meloxicam (MOBIC) 15 MG tablet, Take 1 tablet (15 mg total) by mouth daily as needed., Disp: 10 tablet, Rfl: 0 .  valACYclovir (VALTREX) 1000 MG tablet, Take 1 tablet (1,000 mg total) by mouth 3 (three) times daily., Disp: 21 tablet, Rfl: 0  Allergies Codeine; Influenza vaccines; and Nexium [esomeprazole magnesium]  Family History  Problem Relation Age of Onset  . Breast cancer Mother 56    Social History Social History   Tobacco Use  . Smoking status: Former Games developer  . Smokeless tobacco: Never Used  Substance Use Topics  . Alcohol use: Yes    Comment: occasionally  . Drug use: No    Review of Systems Constitutional: No fever/chills Cardiovascular: Denies chest pain. Respiratory: Denies shortness of breath. Gastrointestinal: No abdominal pain.  No nausea, no vomiting.  No diarrhea.   Genitourinary: Negative for dysuria. Musculoskeletal: Negative for back pain.  As above. Skin: Negative for rash.   ____________________________________________  PHYSICAL EXAM:  VITAL SIGNS: ED Triage Vitals  Enc Vitals Group     BP 05/07/17 0833 (!) 167/79     Pulse Rate 05/07/17 0833 60     Resp 05/07/17 0833 16     Temp 05/07/17 0833 98.1 F (36.7 C)     Temp Source 05/07/17 0833 Oral     SpO2 05/07/17 0833 100 %     Weight 05/07/17 0830 168 lb (76.2 kg)     Height 05/07/17 0830 5\' 6"  (1.676 m)     Head Circumference --      Peak Flow --      Pain Score 05/07/17 0830 8      Pain Loc --      Pain Edu? --      Excl. in GC? --     Constitutional: Alert and oriented. Well appearing and in no acute distress. Cardiovascular: Normal rate, regular rhythm. Grossly normal heart sounds.  Good peripheral circulation. Respiratory: Normal respiratory effort without tachypnea nor retractions. Breath sounds are clear and equal bilaterally. No wheezes, rales, rhonchi. Musculoskeletal:  Bilateral pedal pulses equal and easily palpated.      Right lower leg:  No tenderness or edema.      Left lower leg:  No tenderness or edema.   Except: Right dorsal foot along mid metatarsals mild to moderate tenderness to palpation, mild pain along the proximal second and third metatarsal, no swelling, no ecchymosis, skin intact, no erythema, normal distal sensation and capillary refill, no pain with ankle rotation, minimal pain with plantarflexion and dorsiflexion, full range of motion present, right lower extremity otherwise nontender and no edema noted. Neurologic:  Normal speech and language.  Speech is normal. No gait instability.  Skin:  Skin is warm, dry and intact. No rash noted. Psychiatric: Mood and affect are normal. Speech and behavior are normal. Patient exhibits appropriate insight and judgment   ___________________________________________   LABS (all labs ordered are listed, but only abnormal results are displayed)  Labs Reviewed  URIC ACID   ____________________________________________  RADIOLOGY  Dg Foot Complete Right  Result Date: 05/07/2017 CLINICAL DATA:  Right foot pain.  No injury. EXAM: RIGHT FOOT COMPLETE - 3+ VIEW COMPARISON:  No recent. FINDINGS: No acute bony or joint abnormality. Corticated bony density noted the base of the right fifth metatarsal most likely represents a old fracture fragment or non fused secondary ossification center. Degenerative changes first MTP joint. IMPRESSION: No acute abnormality identified. Corticated bony density noted the base of  the right fifth metatarsal most likely old fracture fragment or non fused secondary ossification center. Degenerative changes first MTP joint. Electronically Signed   By: Maisie Fus  Register   On: 05/07/2017 09:22   ____________________________________________   PROCEDURES Procedures    INITIAL IMPRESSION / ASSESSMENT AND PLAN / ED COURSE  Pertinent labs & imaging results that were available during my care of the patient were reviewed by me and considered in my medical decision making (see chart for details).  Very well-appearing patient.  No acute distress.  Suspect inflammatory pain, discuss mechanical injury, versus arthritis versus gout.  As pain even to light touch of sheath, concern for gout, no personal or family history.  Uric acid unremarkable, discussed the importance of this.  X-ray reviewed, radiologist results as above and reviewed by myself and discussed with patient.  Suspect inflammatory pain and discussed use of oral Mobic.  Patient declined postoperative shoe splint and states that she will be off this weekend and  can rest it.  Encourage PCP orthopedic follow-up for continued pain.Discussed indication, risks and benefits of medications with patient.  Discussed follow up with Primary care physician this week. Discussed follow up and return parameters including no resolution or any worsening concerns. Patient verbalized understanding and agreed to plan.   ____________________________________________   FINAL CLINICAL IMPRESSION(S) / ED DIAGNOSES  Final diagnoses:  Right foot pain     ED Discharge Orders        Ordered    meloxicam (MOBIC) 15 MG tablet  Daily PRN     05/07/17 0941       Note: This dictation was prepared with Dragon dictation along with smaller phrase technology. Any transcriptional errors that result from this process are unintentional.         Renford DillsMiller, Andi Mahaffy, NP 05/07/17 1050

## 2017-05-07 NOTE — ED Triage Notes (Signed)
Patient c/o ongoing pain in her right foot pain that started on Sunday.  Patient denies injury or fall.

## 2017-09-15 ENCOUNTER — Other Ambulatory Visit: Payer: Self-pay | Admitting: Student

## 2017-09-15 DIAGNOSIS — M25562 Pain in left knee: Secondary | ICD-10-CM

## 2017-09-17 ENCOUNTER — Ambulatory Visit
Admission: RE | Admit: 2017-09-17 | Discharge: 2017-09-17 | Disposition: A | Discharge status: Home / Self Care | Payer: BLUE CROSS/BLUE SHIELD | Source: Ambulatory Visit | Attending: Student | Admitting: Student

## 2017-09-17 DIAGNOSIS — M1712 Unilateral primary osteoarthritis, left knee: Secondary | ICD-10-CM | POA: Insufficient documentation

## 2017-09-17 DIAGNOSIS — M25562 Pain in left knee: Secondary | ICD-10-CM | POA: Diagnosis present

## 2018-01-20 ENCOUNTER — Other Ambulatory Visit: Payer: Self-pay | Admitting: Family Medicine

## 2018-01-20 DIAGNOSIS — Z1231 Encounter for screening mammogram for malignant neoplasm of breast: Secondary | ICD-10-CM

## 2018-02-15 ENCOUNTER — Other Ambulatory Visit: Payer: Self-pay | Admitting: Family Medicine

## 2018-02-15 ENCOUNTER — Ambulatory Visit
Admission: RE | Admit: 2018-02-15 | Discharge: 2018-02-15 | Disposition: A | Discharge status: Home / Self Care | Payer: BLUE CROSS/BLUE SHIELD | Source: Ambulatory Visit | Attending: Family Medicine | Admitting: Family Medicine

## 2018-02-15 ENCOUNTER — Encounter (INDEPENDENT_AMBULATORY_CARE_PROVIDER_SITE_OTHER): Payer: Self-pay

## 2018-02-15 DIAGNOSIS — Z1231 Encounter for screening mammogram for malignant neoplasm of breast: Secondary | ICD-10-CM

## 2018-02-15 DIAGNOSIS — R928 Other abnormal and inconclusive findings on diagnostic imaging of breast: Secondary | ICD-10-CM

## 2018-02-15 DIAGNOSIS — N6489 Other specified disorders of breast: Secondary | ICD-10-CM

## 2018-02-23 ENCOUNTER — Ambulatory Visit
Admission: RE | Admit: 2018-02-23 | Discharge: 2018-02-23 | Disposition: A | Discharge status: Home / Self Care | Payer: BLUE CROSS/BLUE SHIELD | Source: Ambulatory Visit | Attending: Family Medicine | Admitting: Family Medicine

## 2018-02-23 DIAGNOSIS — N6489 Other specified disorders of breast: Secondary | ICD-10-CM | POA: Diagnosis present

## 2018-02-23 DIAGNOSIS — R928 Other abnormal and inconclusive findings on diagnostic imaging of breast: Secondary | ICD-10-CM

## 2020-06-11 ENCOUNTER — Other Ambulatory Visit: Payer: Self-pay | Admitting: Physician Assistant

## 2020-06-11 ENCOUNTER — Other Ambulatory Visit: Payer: Self-pay | Admitting: Family Medicine

## 2020-06-11 DIAGNOSIS — Z1231 Encounter for screening mammogram for malignant neoplasm of breast: Secondary | ICD-10-CM

## 2020-06-18 ENCOUNTER — Other Ambulatory Visit: Payer: Self-pay

## 2020-06-18 ENCOUNTER — Ambulatory Visit
Admission: RE | Admit: 2020-06-18 | Discharge: 2020-06-18 | Disposition: A | Discharge status: Home / Self Care | Payer: BC Managed Care – PPO | Source: Ambulatory Visit | Attending: Physician Assistant | Admitting: Physician Assistant

## 2020-06-18 DIAGNOSIS — Z1231 Encounter for screening mammogram for malignant neoplasm of breast: Secondary | ICD-10-CM | POA: Diagnosis not present

## 2020-06-24 ENCOUNTER — Other Ambulatory Visit: Payer: Self-pay | Admitting: Physician Assistant

## 2020-06-24 DIAGNOSIS — N6310 Unspecified lump in the right breast, unspecified quadrant: Secondary | ICD-10-CM

## 2020-06-24 DIAGNOSIS — R928 Other abnormal and inconclusive findings on diagnostic imaging of breast: Secondary | ICD-10-CM

## 2020-06-25 ENCOUNTER — Ambulatory Visit
Admission: RE | Admit: 2020-06-25 | Discharge: 2020-06-25 | Disposition: A | Discharge status: Home / Self Care | Payer: BC Managed Care – PPO | Source: Ambulatory Visit | Attending: Physician Assistant | Admitting: Physician Assistant

## 2020-06-25 ENCOUNTER — Other Ambulatory Visit: Payer: Self-pay

## 2020-06-25 DIAGNOSIS — N6310 Unspecified lump in the right breast, unspecified quadrant: Secondary | ICD-10-CM

## 2020-06-25 DIAGNOSIS — R928 Other abnormal and inconclusive findings on diagnostic imaging of breast: Secondary | ICD-10-CM

## 2021-05-26 ENCOUNTER — Other Ambulatory Visit: Payer: Self-pay | Admitting: Physician Assistant

## 2021-05-26 DIAGNOSIS — Z1231 Encounter for screening mammogram for malignant neoplasm of breast: Secondary | ICD-10-CM

## 2021-07-08 ENCOUNTER — Ambulatory Visit
Admission: RE | Admit: 2021-07-08 | Discharge: 2021-07-08 | Disposition: A | Discharge status: Home / Self Care | Payer: BC Managed Care – PPO | Source: Ambulatory Visit | Attending: Physician Assistant | Admitting: Physician Assistant

## 2021-07-08 DIAGNOSIS — Z1231 Encounter for screening mammogram for malignant neoplasm of breast: Secondary | ICD-10-CM | POA: Insufficient documentation

## 2022-09-03 ENCOUNTER — Other Ambulatory Visit: Payer: Self-pay | Admitting: Physician Assistant

## 2022-09-03 DIAGNOSIS — Z1231 Encounter for screening mammogram for malignant neoplasm of breast: Secondary | ICD-10-CM

## 2022-09-29 ENCOUNTER — Ambulatory Visit
Admission: RE | Admit: 2022-09-29 | Discharge: 2022-09-29 | Disposition: A | Discharge status: Home / Self Care | Payer: Medicare Other | Source: Ambulatory Visit | Attending: Physician Assistant | Admitting: Physician Assistant

## 2022-09-29 DIAGNOSIS — Z1231 Encounter for screening mammogram for malignant neoplasm of breast: Secondary | ICD-10-CM | POA: Insufficient documentation

## 2022-10-01 ENCOUNTER — Other Ambulatory Visit: Payer: Self-pay | Admitting: Physician Assistant

## 2022-10-01 DIAGNOSIS — R928 Other abnormal and inconclusive findings on diagnostic imaging of breast: Secondary | ICD-10-CM

## 2022-10-06 ENCOUNTER — Ambulatory Visit
Admission: RE | Admit: 2022-10-06 | Discharge: 2022-10-06 | Disposition: A | Discharge status: Home / Self Care | Payer: Medicare Other | Source: Ambulatory Visit | Attending: Physician Assistant | Admitting: Physician Assistant

## 2022-10-06 DIAGNOSIS — R928 Other abnormal and inconclusive findings on diagnostic imaging of breast: Secondary | ICD-10-CM | POA: Insufficient documentation

## 2023-07-12 ENCOUNTER — Ambulatory Visit
Admission: EM | Admit: 2023-07-12 | Discharge: 2023-07-12 | Disposition: A | Discharge status: Home / Self Care | Attending: Emergency Medicine | Admitting: Emergency Medicine

## 2023-07-12 DIAGNOSIS — B9689 Other specified bacterial agents as the cause of diseases classified elsewhere: Secondary | ICD-10-CM | POA: Insufficient documentation

## 2023-07-12 DIAGNOSIS — L089 Local infection of the skin and subcutaneous tissue, unspecified: Secondary | ICD-10-CM | POA: Insufficient documentation

## 2023-07-12 MED ORDER — CEPHALEXIN 500 MG PO CAPS: 1000.0000 mg | ORAL_CAPSULE | Freq: Two times a day (BID) | ORAL | 0 refills | Status: AC

## 2023-07-12 NOTE — ED Provider Notes (Signed)
 HPI  SUBJECTIVE:  Audrey Hanna is a 68 y.o. female who presents with a pruritic blister that initially started as a pustule after having a blood draw on her her left arm 4 days ago.  No surrounding erythema.  No fevers, pus draining from the wound, induration.  She tried cleaning with alcohol, hydrocortisone cream and topical and Benadryl  for the itching and keeping it covered with a Band-Aid.  Hydrocortisone cream will help with the itching.  No aggravating factors.  She has a past medical history of hypertension.  No history of MRSA.  PCP: [facility]    Past Medical History:  Diagnosis Date   Anxiety    GERD (gastroesophageal reflux disease)    Headache    Hypertension    Insomnia     Past Surgical History:  Procedure Laterality Date   ABDOMINAL HYSTERECTOMY     2009 BSO   KNEE ARTHROSCOPY Right 03/27/2015   Procedure: ARTHROSCOPY KNEE RIGHT, PARTIAL MEDIAL AND LATERAL MENISECTOMY, CHONDROPLASTY PATELLOFEMORAL;  Surgeon: Arlyne Lame, MD;  Location: [facility];  Service: Orthopedics;  Laterality: Right;   TONSILLECTOMY      Family History  Problem Relation Age of Onset   Breast cancer Mother 48    Social History   Tobacco Use   Smoking status: Former   Smokeless tobacco: Never  Vaping Use   Vaping status: Never Used  Substance Use Topics   Alcohol use: Yes    Comment: occasionally   Drug use: No    No current facility-administered medications for this encounter.  Current Outpatient Medications:    cephALEXin (KEFLEX) 500 MG capsule, Take 2 capsules (1,000 mg total) by mouth 2 (two) times daily for 5 days., Disp: 20 capsule, Rfl: 0   famotidine (PEPCID) 10 MG tablet, Take 10 mg by mouth daily., Disp: , Rfl:    losartan-hydrochlorothiazide (HYZAAR) 100-25 MG tablet, Take 1 tablet by mouth daily., Disp: , Rfl:    naproxen sodium (ANAPROX) 220 MG tablet, Take 220 mg by mouth 2 (two) times daily with a meal. Reported on 03/27/2015, Disp: , Rfl:    potassium  chloride (KLOR-CON) 10 MEQ tablet, TAKE 1 TABLET(10 MEQ) BY MOUTH DAILY, Disp: , Rfl:   Allergies  Allergen Reactions   Esomeprazole Magnesium Anxiety, Nausea Only and Other (See Comments)    Flu like symptoms   Influenza Vaccines     TRIVALENT 2011-2012   Codeine Other (See Comments)     ROS  As noted in HPI.   Physical Exam  BP (!) 160/69 (BP Location: Left Arm)   Pulse (!) 58   Temp 98.6 F (37 C) (Oral)   Resp 16   Ht 5\' 5"  (1.651 m)   Wt 77.1 kg   SpO2 100%   BMI 28.29 kg/m   Constitutional: Well developed, well nourished, no acute distress Eyes:  EOMI, conjunctiva normal bilaterally HENT: Normocephalic, atraumatic,mucus membranes moist Respiratory: Normal inspiratory effort Cardiovascular: Normal rate GI: nondistended skin: 1 x 1 cm taut blister left AC fossa filled with serous fluid with no surrounding erythema, edema, induration.  Musculoskeletal: no deformities Neurologic: Alert & oriented x 3, no focal neuro deficits Psychiatric: Speech and behavior appropriate   ED Course   Medications - No data to display  Orders Placed This Encounter  Procedures   Aerobic Culture w Gram Stain (superficial specimen)    Standing Status:   Standing    Number of Occurrences:   1    No results  found for this or any previous visit (from the past 24 hours). No results found.  ED Clinical Impression  1. Superficial bacterial skin infection      ED Assessment/Plan     Procedure note: After obtaining verbal consent, cleaned area thoroughly with alcohol.  Using sterile 18-gauge needle, made a single stab incision in the blister and expressed a good deal of serous fluid.  Sent material off for culture.  Completely drained the blister.  Placed Band-Aid.  Patient tolerated procedure well.  Patient presents with a superficial skin infection after having a blood draw.  There is no evidence of an abscess.  Will send home with Keflex for 5 days.  Advised patient to keep  this clean with soap and water, apply bacitracin.  Follow-up with PCP or may return here for worsening symptoms..  Discussed  MDM, treatment plan, and plan for follow-up with patient. . patient agrees with plan.   Meds ordered this encounter  Medications   cephALEXin (KEFLEX) 500 MG capsule    Sig: Take 2 capsules (1,000 mg total) by mouth 2 (two) times daily for 5 days.    Dispense:  20 capsule    Refill:  0      *This clinic note was created using Scientist, clinical (histocompatibility and immunogenetics). Therefore, there may be occasional mistakes despite careful proofreading.  ?    Ethlyn Herd, MD 07/14/23 1431

## 2023-07-12 NOTE — Discharge Instructions (Addendum)
 Keep this clean with soap and water.  Apply bacitracin.  This skin will eventually peel off scab.  Finish the Keflex.  We will contact you if and only if the wound culture comes back positive and we need to change antibiotics.

## 2023-07-12 NOTE — ED Triage Notes (Signed)
 Pt c/o blister in L arm x4 days. States had blood drawn w/PCP on 5/29 & lab tech didn't do a good job. Area itchy & increasing in size.

## 2023-07-13 ENCOUNTER — Ambulatory Visit: Payer: Self-pay | Admitting: Emergency Medicine

## 2023-07-16 LAB — AEROBIC CULTURE W GRAM STAIN (SUPERFICIAL SPECIMEN)
Culture: NO GROWTH
Gram Stain: NONE SEEN

## 2023-12-11 IMAGING — MG MM DIGITAL SCREENING BILAT W/ TOMO AND CAD
8 series · 8 of 24 positions shown · non-contrast
Comparison: Previous exam(s).

CLINICAL DATA: Screening.

EXAM:
DIGITAL SCREENING BILATERAL MAMMOGRAM WITH TOMOSYNTHESIS AND CAD
TECHNIQUE: Bilateral screening digital craniocaudal and mediolateral oblique
mammograms were obtained. Bilateral screening digital breast
tomosynthesis was performed. The images were evaluated with
computer-aided detection.

[R CC synth-2D]
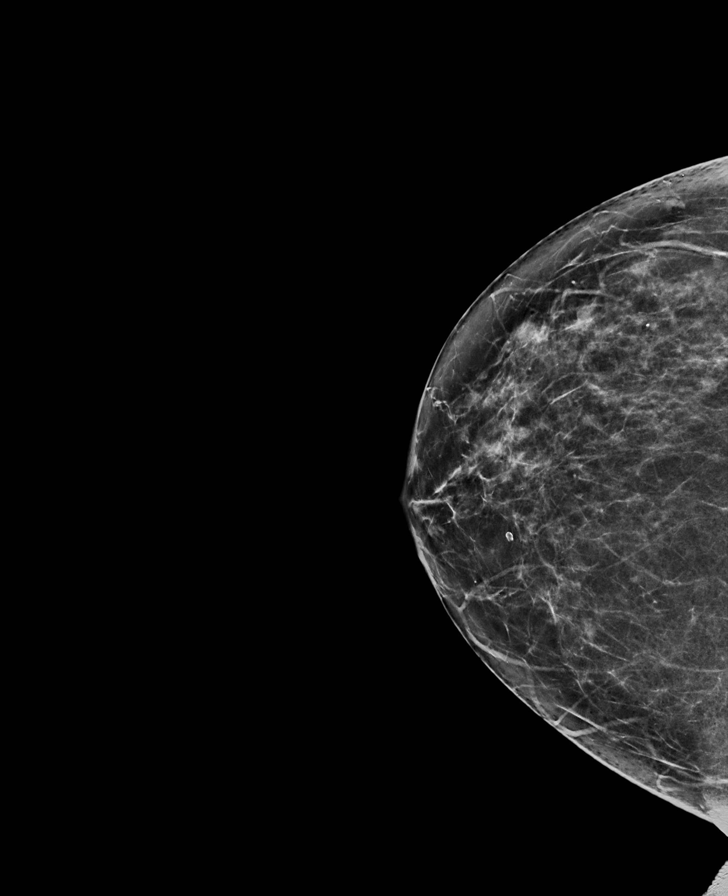

[R MLO synth-2D]
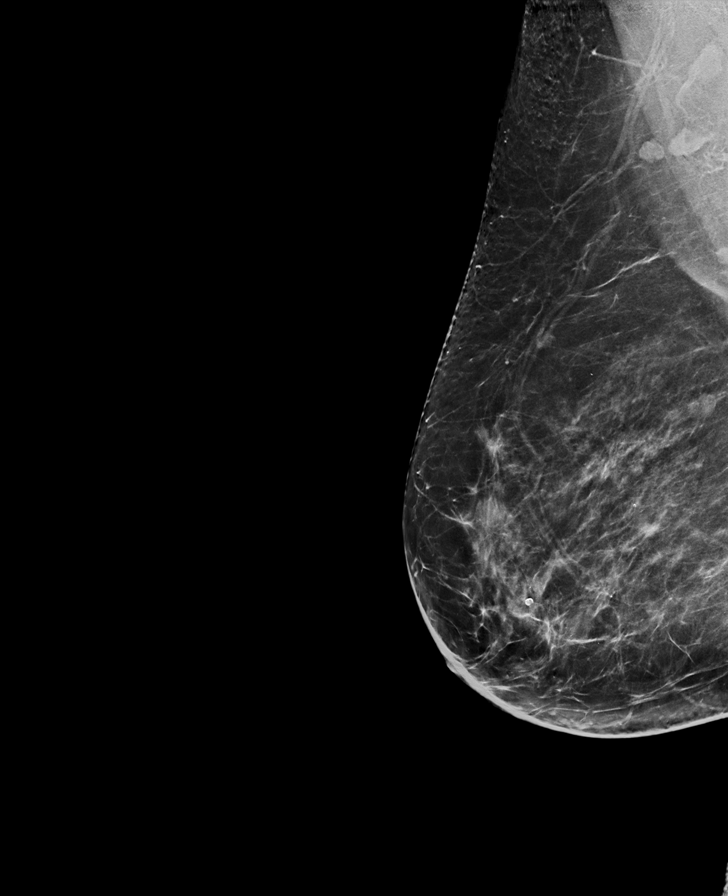

[L MLO synth-2D]
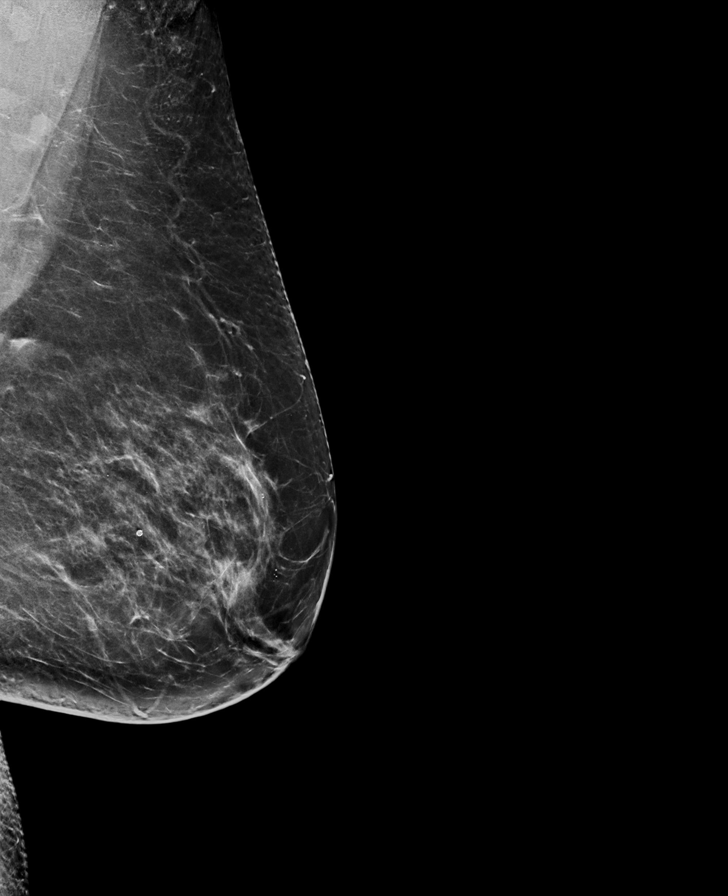

[L CC synth-2D]
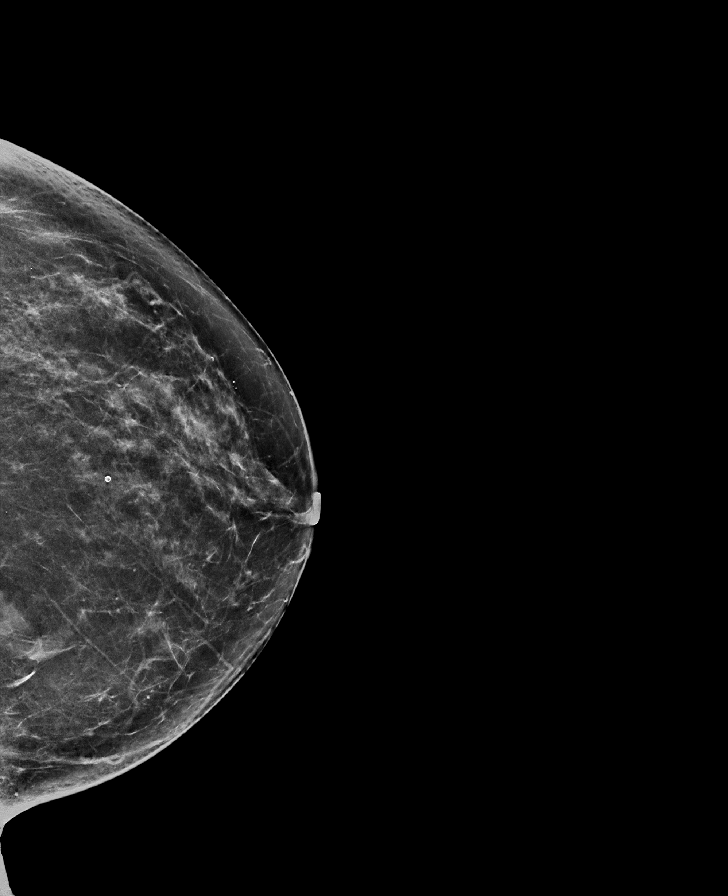

[L CC tomo · tomo slice 37/74.0]
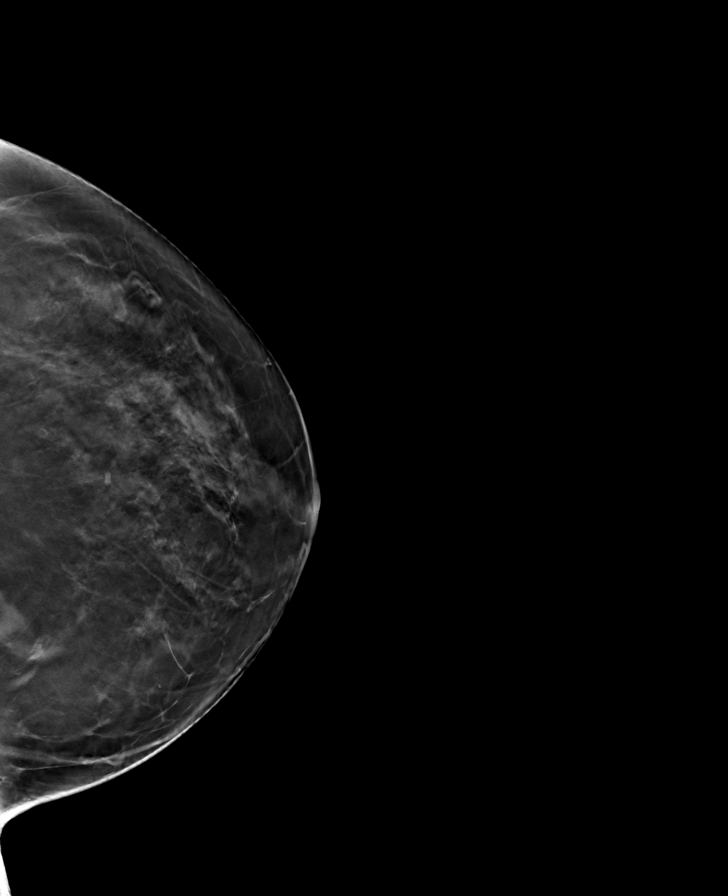

[L MLO tomo · tomo slice 43/85.0]
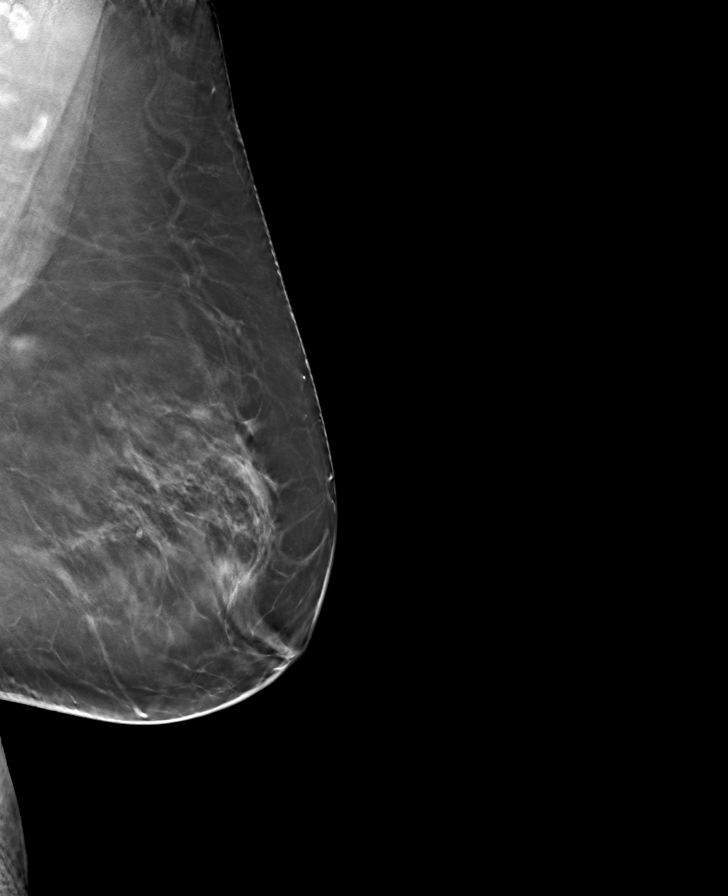

[R CC tomo · tomo slice 35/69.0]
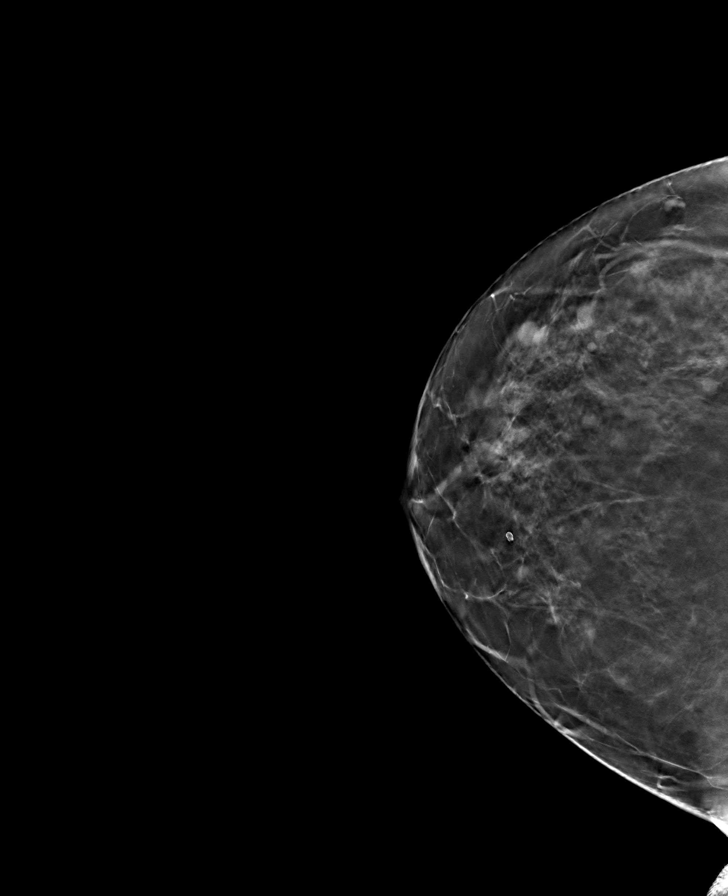

[R MLO tomo · tomo slice 43/84.0]
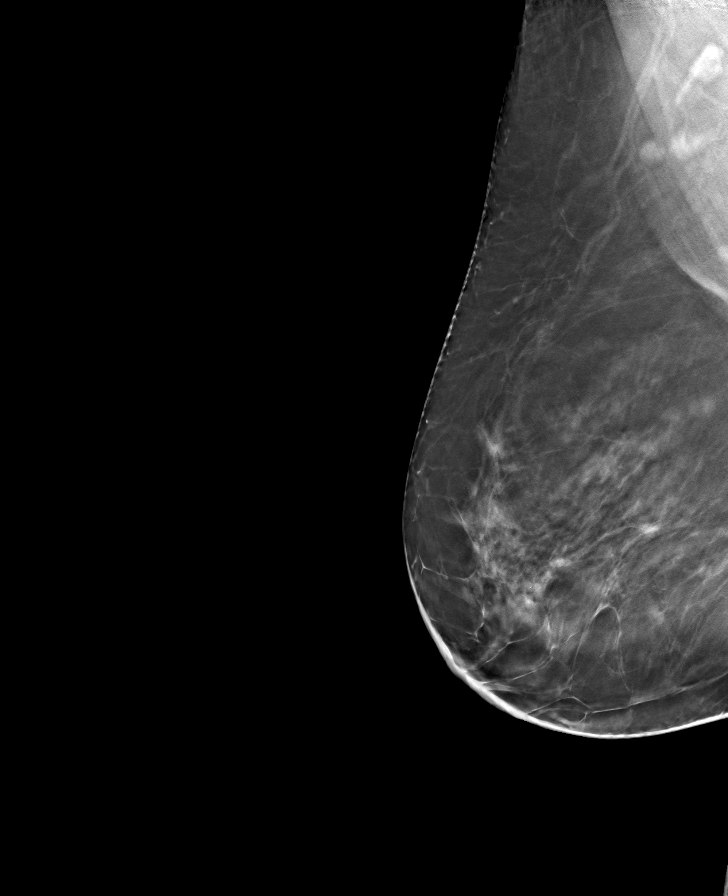

[8 of 24 positions shown; findings below may reference images not displayed]

ACR Breast Density Category b: There are scattered areas of
fibroglandular density.
FINDINGS: There are no findings suspicious for malignancy.
IMPRESSION: No mammographic evidence of malignancy. A result letter of this
screening mammogram will be mailed directly to the patient.

RECOMMENDATION:
Screening mammogram in one year. (Code:51-O-LD2)

BI-RADS CATEGORY  1: Negative.

## 2024-02-24 ENCOUNTER — Other Ambulatory Visit: Payer: Self-pay | Admitting: Physician Assistant

## 2024-02-24 DIAGNOSIS — Z1231 Encounter for screening mammogram for malignant neoplasm of breast: Secondary | ICD-10-CM

## 2024-03-27 ENCOUNTER — Ambulatory Visit
